# Patient Record
Sex: Male | Born: 1990 | Race: White | Hispanic: No | Marital: Single | Smoking: Current every day smoker
Health system: Southern US, Community
[De-identification: ages and names within clinical notes are randomized; demographics above are authoritative.]

---

## 2018-04-11 ENCOUNTER — Emergency Department (HOSPITAL_COMMUNITY)
Admission: EM | Admit: 2018-04-11 | Discharge: 2018-04-11 | Disposition: A | Discharge status: Home / Self Care | Payer: BLUE CROSS/BLUE SHIELD | Attending: Emergency Medicine | Admitting: Emergency Medicine

## 2018-04-11 ENCOUNTER — Other Ambulatory Visit: Payer: Self-pay

## 2018-04-11 ENCOUNTER — Emergency Department (HOSPITAL_COMMUNITY): Payer: BLUE CROSS/BLUE SHIELD

## 2018-04-11 ENCOUNTER — Encounter (HOSPITAL_COMMUNITY): Payer: Self-pay | Admitting: Emergency Medicine

## 2018-04-11 DIAGNOSIS — F17210 Nicotine dependence, cigarettes, uncomplicated: Secondary | ICD-10-CM | POA: Insufficient documentation

## 2018-04-11 DIAGNOSIS — R630 Anorexia: Secondary | ICD-10-CM | POA: Diagnosis not present

## 2018-04-11 DIAGNOSIS — R319 Hematuria, unspecified: Secondary | ICD-10-CM | POA: Insufficient documentation

## 2018-04-11 DIAGNOSIS — R1011 Right upper quadrant pain: Secondary | ICD-10-CM | POA: Insufficient documentation

## 2018-04-11 LAB — COMPREHENSIVE METABOLIC PANEL
ALBUMIN: 4 g/dL (ref 3.5–5.0)
ALK PHOS: 69 U/L (ref 38–126)
ALT: 24 U/L (ref 0–44)
ANION GAP: 11 (ref 5–15)
AST: 29 U/L (ref 15–41)
BILIRUBIN TOTAL: 1 mg/dL (ref 0.3–1.2)
BUN: 12 mg/dL (ref 6–20)
CALCIUM: 9.8 mg/dL (ref 8.9–10.3)
CO2: 27 mmol/L (ref 22–32)
Chloride: 97 mmol/L — ABNORMAL LOW (ref 98–111)
Creatinine, Ser: 0.93 mg/dL (ref 0.61–1.24)
GFR calc Af Amer: >60 mL/min (ref >60)
GLUCOSE: 87 mg/dL (ref 70–99)
POTASSIUM: 4.6 mmol/L (ref 3.5–5.1)
Sodium: 135 mmol/L (ref 135–145)
Total Protein: 8.9 g/dL — ABNORMAL HIGH (ref 6.5–8.1)

## 2018-04-11 LAB — URINALYSIS, ROUTINE W REFLEX MICROSCOPIC
Bacteria, UA: NONE SEEN
Bilirubin Urine: NEGATIVE
GLUCOSE, UA: NEGATIVE mg/dL
KETONES UR: 20 mg/dL — AB
LEUKOCYTES UA: NEGATIVE
NITRITE: NEGATIVE
PH: 5 (ref 5.0–8.0)
Protein, ur: NEGATIVE mg/dL
SPECIFIC GRAVITY, URINE: 1.026 (ref 1.005–1.030)

## 2018-04-11 LAB — CBC
HCT: 45.7 % (ref 39.0–52.0)
Hemoglobin: 15 g/dL (ref 13.0–17.0)
MCH: 30.7 pg (ref 26.0–34.0)
MCHC: 32.8 g/dL (ref 30.0–36.0)
MCV: 93.5 fL (ref 78.0–100.0)
PLATELETS: 216 10*3/uL (ref 150–400)
RBC: 4.89 MIL/uL (ref 4.22–5.81)
RDW: 13.5 % (ref 11.5–15.5)
WBC: 11.1 10*3/uL — AB (ref 4.0–10.5)

## 2018-04-11 LAB — LIPASE, BLOOD: Lipase: 52 U/L — ABNORMAL HIGH (ref 11–51)

## 2018-04-11 MED ORDER — IOHEXOL 300 MG/ML  SOLN
100.0000 mL | Freq: Once | INTRAMUSCULAR | Status: AC | PRN
  Administered 2018-04-11: 100 mL via INTRAVENOUS

## 2018-04-11 NOTE — ED Notes (Signed)
Pt reports R sided abd pain X3 days. Sharp, constant, 8/10. Pt also reports nausea. [facility] and was referred here for further eval.

## 2018-04-11 NOTE — ED Provider Notes (Signed)
[facility] EMERGENCY DEPARTMENT Provider Note   CSN: [id number] Arrival date & time: 04/11/18  1911     History   Chief Complaint Chief Complaint  Patient presents with  . Abdominal Pain    HPI Eduardo Summers is a 27 y.o. male.  HPI Eduardo Summers is a 27 y.o. male presents to emergency department complaining of abdominal pain.  Patient reports pain for about 3 to 4 days in the right side of his abdomen.  Pain does not radiate.  Pain comes and goes in intensity.  There is no pain in his flank.  There is no pain in his lower abdomen.  Denies any urinary symptoms.  Reports pain is worsened sometimes with movement and coughing.  Denies any associated chest pain or shortness of breath.  No fever or chills.  Reports decreased appetite.  Went to urgent care, states they told him that he may have some acid reflux and started on antiacid and he was told if he gets worse to come to the ER.  He states this evening pain intensified so he came here.  Currently pain is minimal.  History reviewed. No pertinent past medical history.  There are no active problems to display for this patient.   History reviewed. No pertinent surgical history.      Home Medications    Prior to Admission medications   Medication Sig Start Date End Date Taking? Authorizing Provider  ranitidine (ZANTAC) 300 MG capsule Take 300 mg by mouth daily. 04/11/18   [provider]    Family History No family history on file.  Social History Social History   Tobacco Use  . Smoking status: Current Every Day Smoker    Packs/day: 1.00    Types: Cigarettes  . Smokeless tobacco: Never Used  Substance Use Topics  . Alcohol use: Yes    Comment: Socially   . Drug use: Yes    Types: Marijuana     Allergies   Patient has no known allergies.   Review of Systems Review of Systems  Constitutional: Negative for chills and fever.  Respiratory: Negative for cough, chest tightness and  shortness of breath.   Cardiovascular: Negative for chest pain, palpitations and leg swelling.  Gastrointestinal: Positive for abdominal pain and nausea. Negative for abdominal distention, diarrhea and vomiting.  Genitourinary: Negative for difficulty urinating, dysuria, frequency, hematuria, scrotal swelling and urgency.  Musculoskeletal: Negative for arthralgias, myalgias, neck pain and neck stiffness.  Skin: Negative for rash.  Allergic/Immunologic: Negative for immunocompromised state.  Neurological: Negative for dizziness, weakness, light-headedness, numbness and headaches.  All other systems reviewed and are negative.    Physical Exam Updated Vital Signs BP (!) 132/91   Pulse 72   Temp 98.4 F (36.9 C) (Oral)   Resp 18   Ht 6\' 1"  (1.854 m)   Wt 74.8 kg (165 lb)   SpO2 98%   BMI 21.77 kg/m   Physical Exam  Constitutional: He appears well-developed and well-nourished. No distress.  HENT:  Head: Normocephalic and atraumatic.  Eyes: Conjunctivae are normal.  Neck: Neck supple.  Cardiovascular: Normal rate, regular rhythm and normal heart sounds.  Pulmonary/Chest: Effort normal. No respiratory distress. He has no wheezes. He has no rales.  Abdominal: Soft. Bowel sounds are normal. He exhibits no distension. There is tenderness in the right upper quadrant. There is no rigidity, no rebound and no guarding.  Musculoskeletal: He exhibits no edema.  Neurological: He is alert.  Skin: Skin is warm and  dry.  Nursing note and vitals reviewed.    ED Treatments / Results  Labs (all labs ordered are listed, but only abnormal results are displayed) Labs Reviewed  LIPASE, BLOOD - Abnormal; Notable for the following components:      Result Value   Lipase 52 (*)    All other components within normal limits  COMPREHENSIVE METABOLIC PANEL - Abnormal; Notable for the following components:   Chloride 97 (*)    Total Protein 8.9 (*)    All other components within normal limits  CBC -  Abnormal; Notable for the following components:   WBC 11.1 (*)    All other components within normal limits  URINALYSIS, ROUTINE W REFLEX MICROSCOPIC - Abnormal; Notable for the following components:   Hgb urine dipstick MODERATE (*)    Ketones, ur 20 (*)    All other components within normal limits    EKG None  Radiology Ct Abdomen Pelvis W Contrast  Result Date: 04/11/2018 CLINICAL DATA:  RIGHT-side abdominal pain with nausea for 3 days EXAM: CT ABDOMEN AND PELVIS WITH CONTRAST TECHNIQUE: Multidetector CT imaging of the abdomen and pelvis was performed using the standard protocol following bolus administration of intravenous contrast. Sagittal and coronal MPR images reconstructed from axial data set. CONTRAST:  OMNIPAQUE IOHEXOL 300 MG/ML SOLN IV. No oral contrast. COMPARISON:  None FINDINGS: Lower chest: Lung bases clear Hepatobiliary: Gallbladder and liver normal appearance Pancreas: Normal appearance Spleen: Normal appearance Adrenals/Urinary Tract: Adrenal glands normal appearance. Kidneys, ureters, and bladder normal appearance Stomach/Bowel: Normal appendix. Stomach and bowel loops normal appearance for technique. Vascular/Lymphatic: Vascular structures grossly patent. Aorta normal caliber. No adenopathy. Reproductive: Unremarkable prostate gland Other: No free air or free fluid. No hernia or acute inflammatory process. Musculoskeletal: Osseous structures unremarkable. IMPRESSION: Normal exam. Electronically Signed   By: Ulyses Southward M.D.   On: 04/11/2018 22:03    Procedures Procedures (including critical care time)  Medications Ordered in ED Medications  iohexol (OMNIPAQUE) 300 MG/ML solution 100 mL (100 mLs Intravenous Contrast Given 04/11/18 2140)     Initial Impression / Assessment and Plan / ED Course  I have reviewed the triage vital signs and the nursing notes.  Pertinent labs & imaging results that were available during my care of the patient were reviewed by me and  considered in my medical decision making (see chart for details).     Patient with right upper quadrant abdominal pain, no guarding or rebound tenderness.  Reports anorexia, slightly elevated white blood cell count, some nausea, but not postprandial.  No CVA tenderness.  Patient does have moderate hemoglobin on the dipstick of his urine, however no red blood cells.  Question possible mild dehydration.  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  His renal function at this time however is normal.  I do not think he has any other signs of rhabdomyolysis at this time.  Vital signs are normal as well.  I will get CT abdomen pelvis for further evaluation.   10:37 PM CT scan is normal.  Reassured patient.  We discussed signs and symptoms that should prompt his return to emergency department.  We discussed this being as a possibility of musculoskeletal pain given that he does have a strenuous job and he does do a lot of heavy lifting.  We did also discuss signs and symptoms that should prompt his return back to emergency department.  Otherwise I will have him follow-up with family doctor for  recheck of urine in 1 week to make sure his hematuria resolves.  Also advised to drink plenty of fluids.  Vitals:   04/11/18 1919 04/11/18 1921 04/11/18 2115 04/11/18 2130  BP: 137/76  131/74 (!) 132/91  Pulse: 93  71 72  Resp: 18     Temp: 98.4 F (36.9 C)     TempSrc: Oral     SpO2: 97%  98% 98%  Weight:  74.8 kg (165 lb)    Height:  6\' 1"  (1.854 m)      Final Clinical Impressions(s) / ED Diagnoses   Final diagnoses:  Right upper quadrant abdominal pain  Hematuria, unspecified type    ED Discharge Orders    None       Jaynie CrumbleKirichenko, Kinan Safley, PA-C 04/11/18 2240    Gerhard MunchLockwood, Robert, MD 04/11/18 2355

## 2018-04-11 NOTE — ED Notes (Signed)
Patient transported to CT 

## 2018-04-11 NOTE — ED Notes (Signed)
Discharge instructions discussed with Pt. Pt verbalized understanding. Pt stable and ambulatory.    

## 2018-04-11 NOTE — ED Provider Notes (Signed)
Patient placed in Quick Look pathway, seen and evaluated   Chief Complaint: abdominal pain  HPI:   Eduardo Summers is a 27 y.o. male who presents to the ED with abdominal pain that started 3 days. The pain started as generalized abdominal pain and now is only on the right side. Patient reports nausea and decreased appetite. Patient went to Urgent Care today and was told if symptoms worsened to go to the ED. The pain has gotten worse. No fever, chills or diarrhea.   ROS: GI: abdominal pain, nausea  Physical Exam:  BP 137/76 (BP Location: Right Arm)   Pulse 93   Temp 98.4 F (36.9 C) (Oral)   Resp 18   Ht 6\' 1"  (1.854 m)   Wt 74.8 kg (165 lb)   SpO2 97%   BMI 21.77 kg/m    Gen: No distress  Neuro: Awake and Alert  Skin: Warm and dry  Abdomen soft, tender with palpation RUQ, no rebound or guarding.       Initiation of care has begun. The patient has been counseled on the process, plan, and necessity for staying for the completion/evaluation, and the remainder of the medical screening examination    Janne Napoleoneese, Marua Qin M, NP 04/11/18 1924    Gerhard MunchLockwood, Robert, MD 04/11/18 2355

## 2018-04-11 NOTE — Discharge Instructions (Addendum)
Tylenol Motrin for your pain.  Avoid any spicy, tomato-based foods, any fatty foods.  Follow-up with family doctor to recheck your urine to make sure that your hematuria has resolved.  Return to emergency department worsening pain, vomiting, fever, any new concerning symptoms.

## 2019-03-26 IMAGING — CT CT ABD-PELV W/ CM
2 of 4 series · 17 of 46 positions shown, 19 images · IV contrast (APPLIED)
Comparison: None

CLINICAL DATA: RIGHT-side abdominal pain with nausea for 3 days

EXAM:
CT ABDOMEN AND PELVIS WITH CONTRAST
TECHNIQUE: Multidetector CT imaging of the abdomen and pelvis was performed
using the standard protocol following bolus administration of
intravenous contrast. Sagittal and coronal MPR images reconstructed
from axial data set.
CONTRAST:  100mL OMNIPAQUE IOHEXOL 300 MG/ML SOLN IV. No oral
contrast.

[Series 3: abdomen 5.0 · axial · 0.72mm/px · z∈[+876,+1311]mm · 14 of 99 slices shown, 16 images]
[im 6/99  soft-tissue]
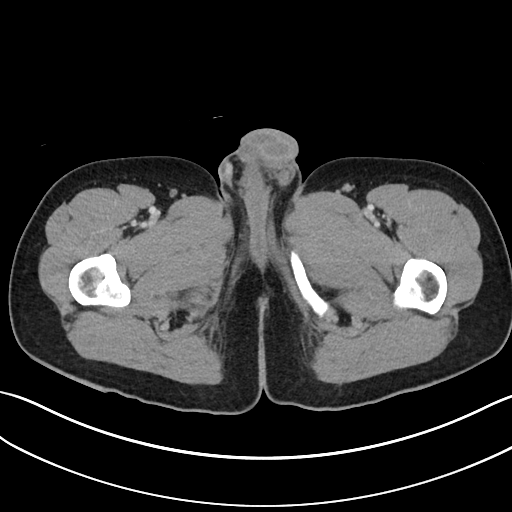
[im 6/99  bone]
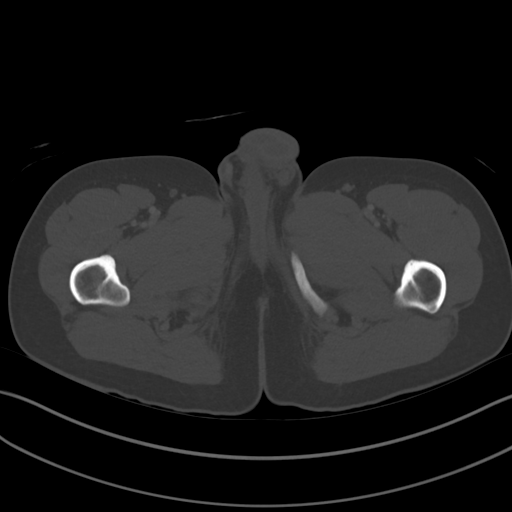
[im 11/99  soft-tissue]
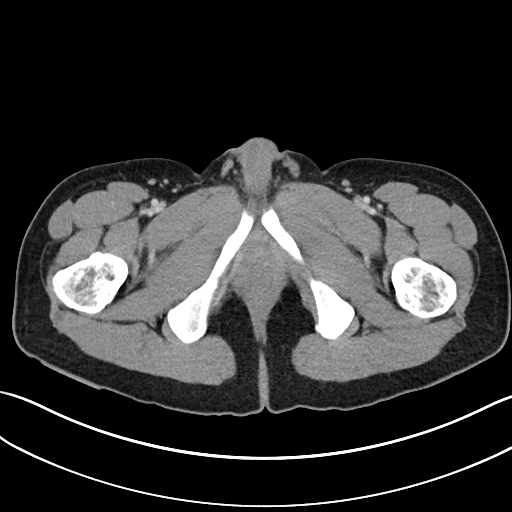
[im 21/99  soft-tissue]
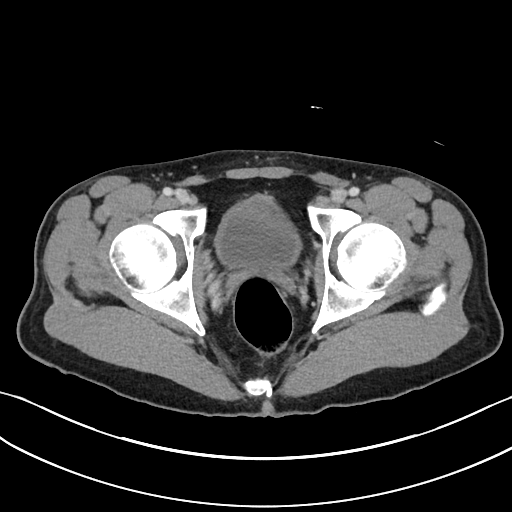
[im 26/99  soft-tissue]
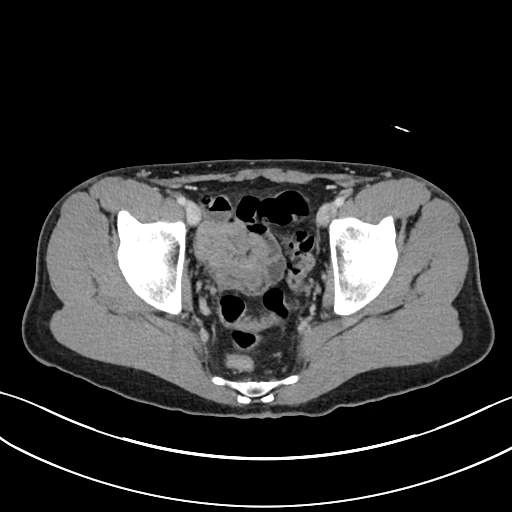
[im 31/99  soft-tissue]
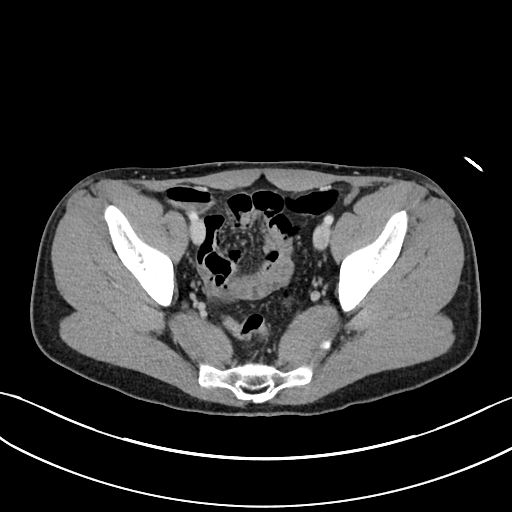
[im 42/99  soft-tissue]
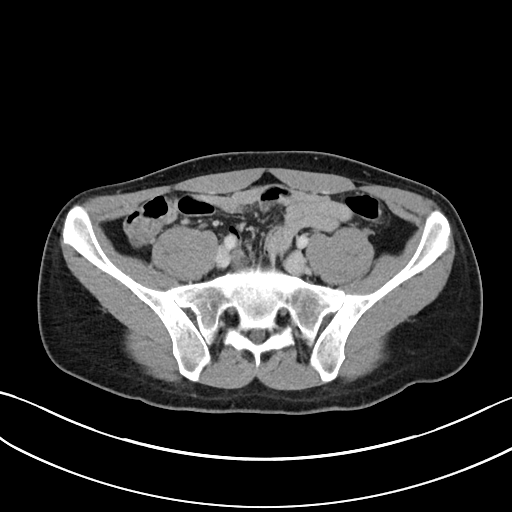
[im 47/99  soft-tissue]
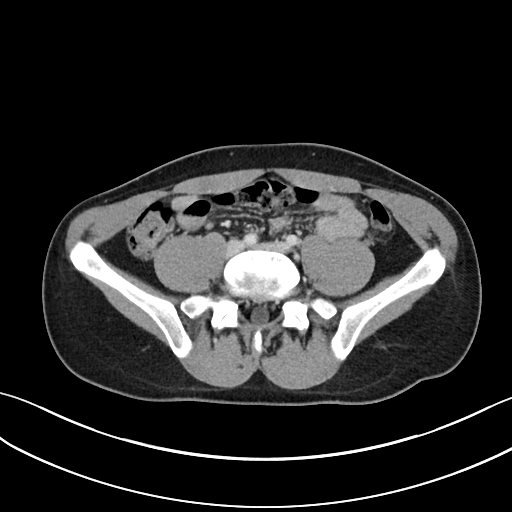
[im 52/99  soft-tissue]
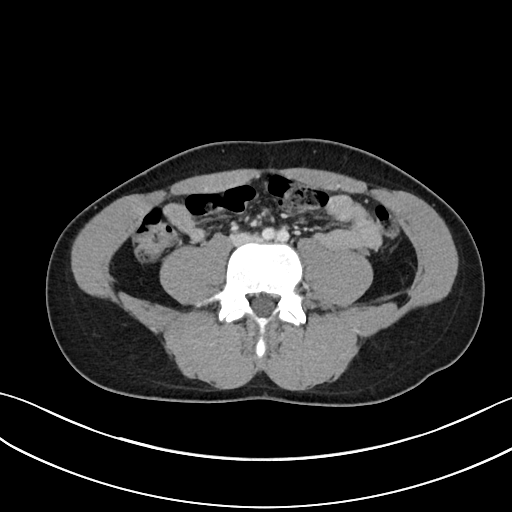
[im 57/99  soft-tissue]
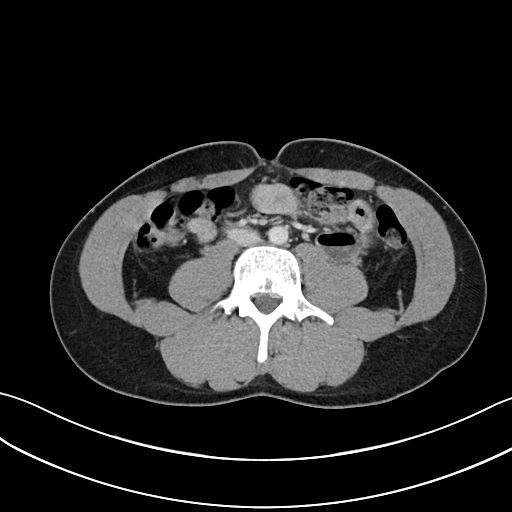
[im 57/99  bone]
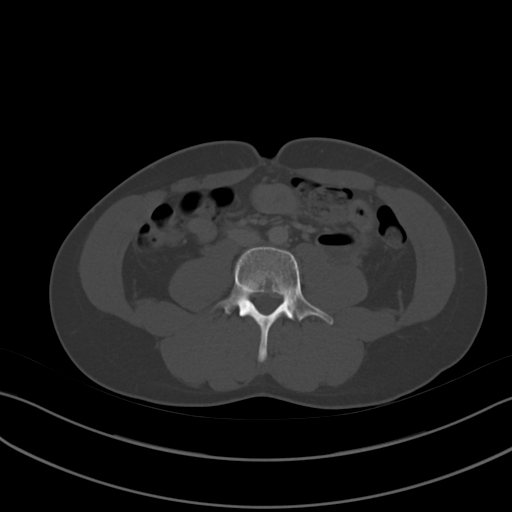
[im 68/99  soft-tissue]
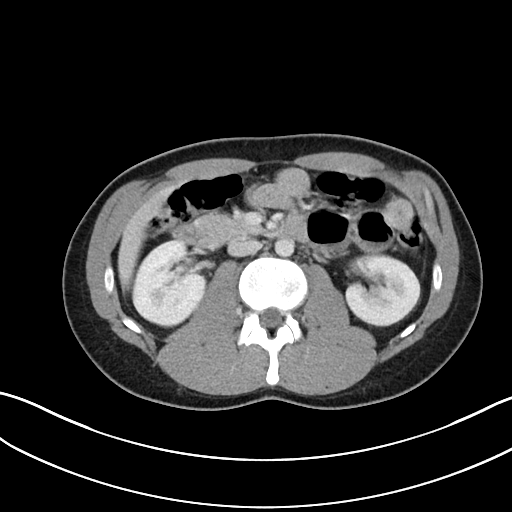
[im 73/99  soft-tissue]
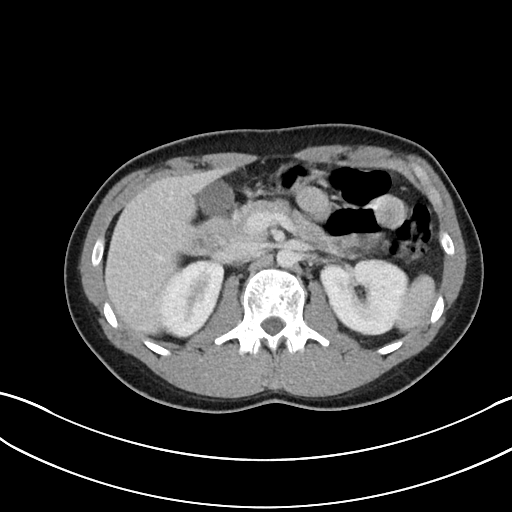
[im 78/99  soft-tissue]
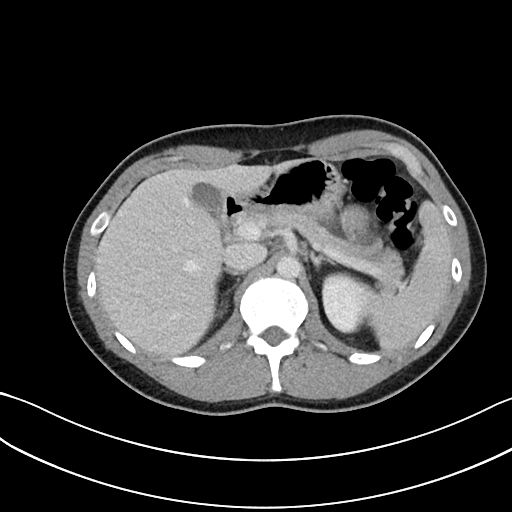
[im 88/99  soft-tissue]
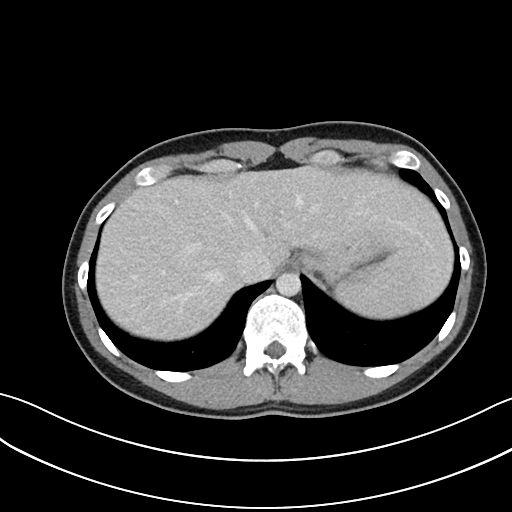
[im 93/99  soft-tissue]
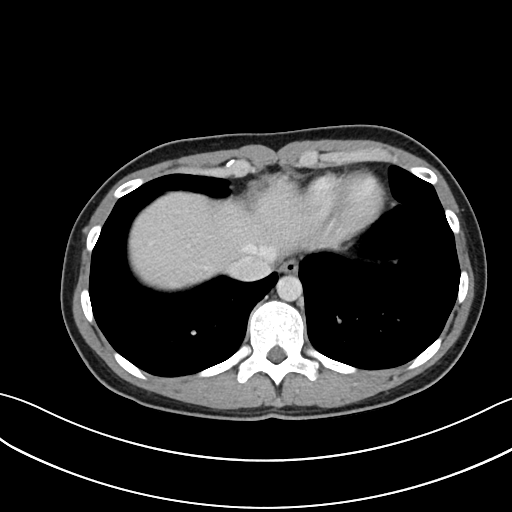

[Series 6: abdomen 3.0 mpr cor · coronal · 0.79mm/px · 3 of 77 slices shown]
[im 34/77  soft-tissue]
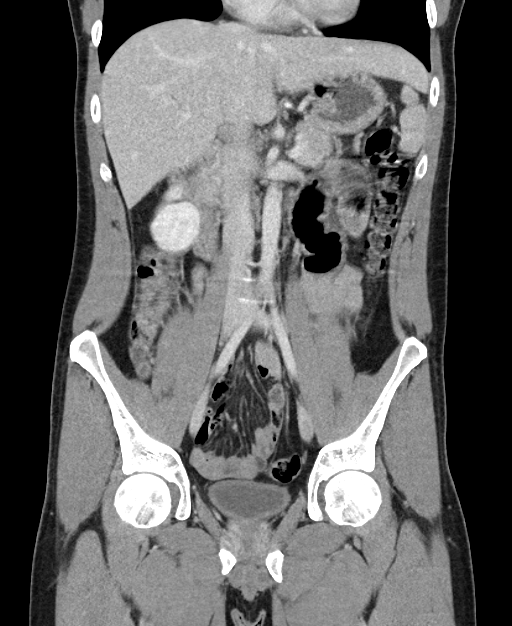
[im 43/77  soft-tissue]
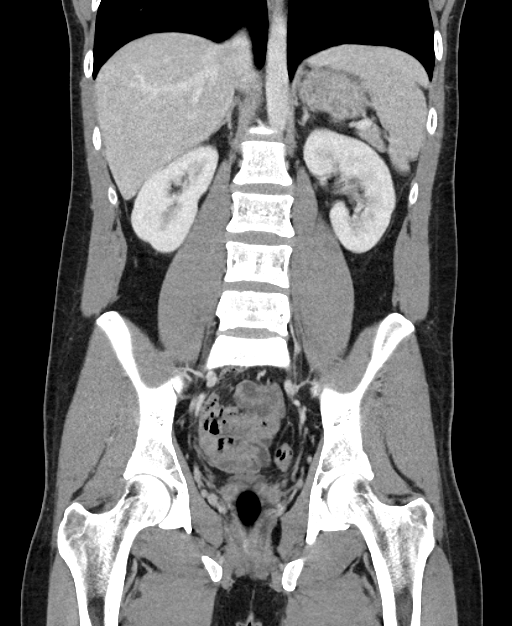
[im 51/77  soft-tissue]
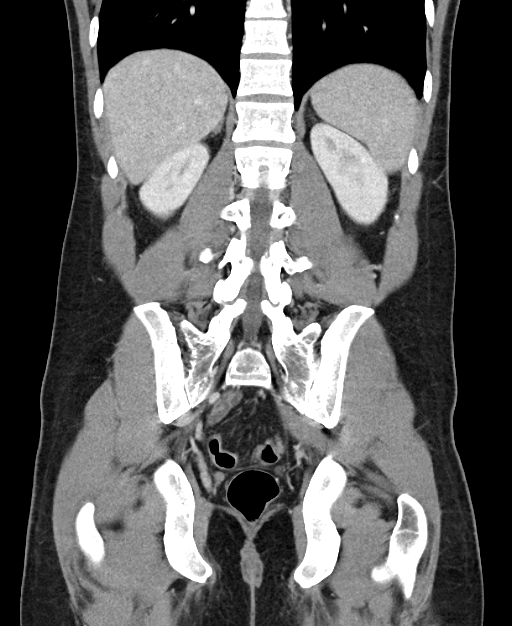

[17 of 46 positions shown; findings below may reference images not displayed]

FINDINGS: Lower chest: Lung bases clear

Hepatobiliary: Gallbladder and liver normal appearance

Pancreas: Normal appearance

Spleen: Normal appearance

Adrenals/Urinary Tract: Adrenal glands normal appearance. Kidneys,
ureters, and bladder normal appearance

Stomach/Bowel: Normal appendix. Stomach and bowel loops normal
appearance for technique.

Vascular/Lymphatic: Vascular structures grossly patent. Aorta normal
caliber. No adenopathy.

Reproductive: Unremarkable prostate gland

Other: No free air or free fluid. No hernia or acute inflammatory
process.

Musculoskeletal: Osseous structures unremarkable.
IMPRESSION: Normal exam.

## 2023-06-22 ENCOUNTER — Ambulatory Visit
Admission: EM | Admit: 2023-06-22 | Discharge: 2023-06-22 | Disposition: A | Discharge status: Home / Self Care | Payer: Commercial Managed Care - PPO | Attending: Internal Medicine | Admitting: Internal Medicine

## 2023-06-22 DIAGNOSIS — B349 Viral infection, unspecified: Secondary | ICD-10-CM | POA: Diagnosis not present

## 2023-06-22 DIAGNOSIS — J029 Acute pharyngitis, unspecified: Secondary | ICD-10-CM | POA: Insufficient documentation

## 2023-06-22 DIAGNOSIS — Z1152 Encounter for screening for COVID-19: Secondary | ICD-10-CM | POA: Insufficient documentation

## 2023-06-22 LAB — POCT RAPID STREP A (OFFICE): Rapid Strep A Screen: NEGATIVE

## 2023-06-22 MED ORDER — PROMETHAZINE-DM 6.25-15 MG/5ML PO SYRP: 5.0000 mL | ORAL_SOLUTION | Freq: Four times a day (QID) | ORAL | 0 refills | Status: AC | PRN

## 2023-06-22 NOTE — ED Triage Notes (Signed)
Pt presents with c/o a sore throat x 2 days. 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  Pt denies fever.

## 2023-06-22 NOTE — Discharge Instructions (Signed)
The clinic will contact you with results of the COVID test and strep throat culture done today if positive.  You may take Promethazine DM as needed for cough.  Please have this medication make you drowsy.  Do not drink alcohol or drive on this medication.  Lots of rest and fluids.  Please follow-up with your PCP in 2 days for recheck.  Please go to the ER for any worsening symptoms.  I hope you feel better soon!

## 2023-06-22 NOTE — ED Provider Notes (Signed)
[facility]    CSN: [id number] Arrival date & time: 06/22/23  0950      History   Chief Complaint Chief Complaint  Patient presents with   Sore Throat    HPI Eduardo Summers is a 32 y.o. male  presents for evaluation of URI symptoms for 2 days. Patient reports associated symptoms of sore throat, cough, congestion. Denies N/V/D, first, ear pain, body aches, shortness of breath. Patient does not have a hx of asthma.  Does smoke.  No known sick contacts but states his daughter goes to school with someone who has strep throat.  His daughter does not have any current symptoms.  Pt has no other concerns at this time.    Sore Throat    History reviewed. No pertinent past medical history.  There are no problems to display for this patient.   History reviewed. No pertinent surgical history.     Home Medications    Prior to Admission medications   Medication Sig Start Date End Date Taking? Authorizing Provider  promethazine-dextromethorphan (PROMETHAZINE-DM) 6.25-15 MG/5ML syrup Take 5 mLs by mouth 4 (four) times daily as needed for cough. 06/22/23  Yes Radford Pax, NP  ranitidine (ZANTAC) 300 MG capsule Take 300 mg by mouth daily. 04/11/18   [provider]    Family History History reviewed. No pertinent family history.  Social History Social History   Tobacco Use   Smoking status: Every Day    Current packs/day: 1.00    Types: Cigarettes   Smokeless tobacco: Never  Substance Use Topics   Alcohol use: Yes    Comment: Socially    Drug use: Yes    Types: Marijuana     Allergies   Patient has no known allergies.   Review of Systems Review of Systems  HENT:  Positive for congestion and sore throat.   Respiratory:  Positive for cough.      Physical Exam Triage Vital Signs ED Triage Vitals [06/22/23 1026]  Encounter Vitals Group     BP 120/76     Systolic BP Percentile      Diastolic BP Percentile      Pulse Rate 82     Resp 18      Temp 98.5 F (36.9 C)     Temp Source Oral     SpO2 96 %     Weight      Height      Head Circumference      Peak Flow      Pain Score 4     Pain Loc      Pain Education      Exclude from Growth Chart    No data found.  Updated Vital Signs BP 120/76 (BP Location: Right Arm)   Pulse 82   Temp 98.5 F (36.9 C) (Oral)   Resp 18   SpO2 96%   Visual Acuity Right Eye Distance:   Left Eye Distance:   Bilateral Distance:    Right Eye Near:   Left Eye Near:    Bilateral Near:     Physical Exam Vitals and nursing note reviewed.  Constitutional:      General: He is not in acute distress.    Appearance: Normal appearance. He is not ill-appearing or toxic-appearing.  HENT:     Head: Normocephalic and atraumatic.     Right Ear: Tympanic membrane and ear canal normal.     Left Ear: Tympanic membrane and ear canal normal.  Nose: Congestion present.     Mouth/Throat:     Mouth: Mucous membranes are moist.     Pharynx: Posterior oropharyngeal erythema present.  Eyes:     Pupils: Pupils are equal, round, and reactive to light.  Cardiovascular:     Rate and Rhythm: Normal rate and regular rhythm.     Heart sounds: Normal heart sounds.  Pulmonary:     Effort: Pulmonary effort is normal.     Breath sounds: Normal breath sounds.  Musculoskeletal:     Cervical back: Normal range of motion and neck supple.  Lymphadenopathy:     Cervical: No cervical adenopathy.  Skin:    General: Skin is warm and dry.  Neurological:     General: No focal deficit present.     Mental Status: He is alert and oriented to person, place, and time.  Psychiatric:        Mood and Affect: Mood normal.        Behavior: Behavior normal.      UC Treatments / Results  Labs (all labs ordered are listed, but only abnormal results are displayed) Labs Reviewed  CULTURE, GROUP A STREP ([facility])  SARS CORONAVIRUS 2 (TAT 6-24 HRS)  POCT RAPID STREP A (OFFICE)    EKG   Radiology No results  found.  Procedures Procedures (including critical care time)  Medications Ordered in UC Medications - No data to display  Initial Impression / Assessment and Plan / UC Course  I have reviewed the triage vital signs and the nursing notes.  Pertinent labs & imaging results that were available during my care of the patient were reviewed by me and considered in my medical decision making (see chart for details).     Reviewed exam and symptoms with patient.  No red flags.  Negative rapid strep, will culture.  COVID PCR and will contact if positive.  Discussed viral illness and symptomatic treatment.  Promethazine DM as needed for cough.  Side effect profile reviewed.  PCP follow-up 2 days for recheck.  ER precautions reviewed and patient verbalized understanding. Final Clinical Impressions(s) / UC Diagnoses   Final diagnoses:  Sore throat  Viral illness     Discharge Instructions      The clinic will contact you with results of the COVID test and strep throat culture done today if positive.  You may take Promethazine DM as needed for cough.  Please have this medication make you drowsy.  Do not drink alcohol or drive on this medication.  Lots of rest and fluids.  Please follow-up with your PCP in 2 days for recheck.  Please go to the ER for any worsening symptoms.  I hope you feel better soon!    ED Prescriptions     Medication Sig Dispense Auth. Provider   promethazine-dextromethorphan (PROMETHAZINE-DM) 6.25-15 MG/5ML syrup Take 5 mLs by mouth 4 (four) times daily as needed for cough. 118 mL Radford Pax, NP      PDMP not reviewed this encounter.   Radford Pax, NP 06/22/23 1103

## 2023-06-23 LAB — SARS CORONAVIRUS 2 (TAT 6-24 HRS): SARS Coronavirus 2: NEGATIVE

## 2023-06-25 LAB — CULTURE, GROUP A STREP (THRC)
